# Patient Record
Sex: Female | Born: 1980 | Race: White | Hispanic: No | Marital: Married | Smoking: Current every day smoker
Health system: Southern US, Community
[De-identification: ages and names within clinical notes are randomized; demographics above are authoritative.]

## PROBLEM LIST (undated history)

## (undated) DIAGNOSIS — G8929 Other chronic pain: Secondary | ICD-10-CM

## (undated) DIAGNOSIS — M5137 Other intervertebral disc degeneration, lumbosacral region: Secondary | ICD-10-CM

## (undated) DIAGNOSIS — M51379 Other intervertebral disc degeneration, lumbosacral region without mention of lumbar back pain or lower extremity pain: Secondary | ICD-10-CM

## (undated) DIAGNOSIS — G43909 Migraine, unspecified, not intractable, without status migrainosus: Secondary | ICD-10-CM

## (undated) DIAGNOSIS — M549 Dorsalgia, unspecified: Secondary | ICD-10-CM

## (undated) HISTORY — PX: ABDOMINAL HYSTERECTOMY: SHX81

## (undated) HISTORY — PX: CHOLECYSTECTOMY: SHX55

---

## 2012-02-14 ENCOUNTER — Encounter (HOSPITAL_COMMUNITY): Payer: Self-pay | Admitting: *Deleted

## 2012-02-14 ENCOUNTER — Emergency Department (INDEPENDENT_AMBULATORY_CARE_PROVIDER_SITE_OTHER): Payer: Medicaid Other

## 2012-02-14 ENCOUNTER — Emergency Department (INDEPENDENT_AMBULATORY_CARE_PROVIDER_SITE_OTHER)
Admission: EM | Admit: 2012-02-14 | Discharge: 2012-02-14 | Disposition: A | Discharge status: Home / Self Care | Payer: Medicaid Other | Source: Home / Self Care | Attending: Emergency Medicine | Admitting: Emergency Medicine

## 2012-02-14 DIAGNOSIS — S335XXA Sprain of ligaments of lumbar spine, initial encounter: Secondary | ICD-10-CM

## 2012-02-14 DIAGNOSIS — S39012A Strain of muscle, fascia and tendon of lower back, initial encounter: Secondary | ICD-10-CM

## 2012-02-14 DIAGNOSIS — M549 Dorsalgia, unspecified: Secondary | ICD-10-CM | POA: Insufficient documentation

## 2012-02-14 HISTORY — DX: Dorsalgia, unspecified: M54.9

## 2012-02-14 HISTORY — DX: Other intervertebral disc degeneration, lumbosacral region: M51.37

## 2012-02-14 HISTORY — DX: Other intervertebral disc degeneration, lumbosacral region without mention of lumbar back pain or lower extremity pain: M51.379

## 2012-02-14 HISTORY — DX: Other chronic pain: G89.29

## 2012-02-14 MED ORDER — KETOROLAC TROMETHAMINE 60 MG/2ML IM SOLN
60.0000 mg | Freq: Once | INTRAMUSCULAR | Status: AC
  Administered 2012-02-14: 60 mg via INTRAMUSCULAR

## 2012-02-14 MED ORDER — METHOCARBAMOL 500 MG PO TABS: 500.0000 mg | ORAL_TABLET | Freq: Three times a day (TID) | ORAL | Status: AC

## 2012-02-14 MED ORDER — ONDANSETRON 4 MG PO TBDP
8.0000 mg | ORAL_TABLET | Freq: Once | ORAL | Status: AC
  Administered 2012-02-14: 8 mg via ORAL

## 2012-02-14 MED ORDER — KETOROLAC TROMETHAMINE 60 MG/2ML IM SOLN
INTRAMUSCULAR | Status: AC
  Filled 2012-02-14: qty 2

## 2012-02-14 MED ORDER — TRAMADOL HCL 50 MG PO TABS: 100.0000 mg | ORAL_TABLET | Freq: Three times a day (TID) | ORAL | Status: AC | PRN

## 2012-02-14 MED ORDER — ONDANSETRON 4 MG PO TBDP
ORAL_TABLET | ORAL | Status: AC
  Filled 2012-02-14: qty 2

## 2012-02-14 MED ORDER — MELOXICAM 15 MG PO TABS: 15.0000 mg | ORAL_TABLET | Freq: Every day | ORAL | Status: DC

## 2012-02-14 NOTE — ED Notes (Signed)
Patient is resting comfortably; denies needs at this time.

## 2012-02-14 NOTE — Discharge Instructions (Signed)
Back Exercises Back exercises help treat and prevent back injuries. The goal of back exercises is to increase the strength of your abdominal and back muscles and the flexibility of your back. These exercises should be started when you no longer have back pain. Back exercises include:  Pelvic Tilt. Lie on your back with your knees bent. Tilt your pelvis until the lower part of your back is against the floor. Hold this position 5 to 10 sec and repeat 5 to 10 times.   Knee to Chest. Pull first 1 knee up against your chest and hold for 20 to 30 seconds, repeat this with the other knee, and then both knees. This may be done with the other leg straight or bent, whichever feels better.   Sit-Ups or Curl-Ups. Bend your knees 90 degrees. Start with tilting your pelvis, and do a partial, slow sit-up, lifting your trunk only 30 to 45 degrees off the floor. Take at least 2 to 3 seconds for each sit-up. Do not do sit-ups with your knees out straight. If partial sit-ups are difficult, simply do the above but with only tightening your abdominal muscles and holding it as directed.   Hip-Lift. Lie on your back with your knees flexed 90 degrees. Push down with your feet and shoulders as you raise your hips a couple inches off the floor; hold for 10 seconds, repeat 5 to 10 times.   Back arches. Lie on your stomach, propping yourself up on bent elbows. Slowly press on your hands, causing an arch in your low back. Repeat 3 to 5 times. Any initial stiffness and discomfort should lessen with repetition over time.   Shoulder-Lifts. Lie face down with arms beside your body. Keep hips and torso pressed to floor as you slowly lift your head and shoulders off the floor.  Do not overdo your exercises, especially in the beginning. Exercises may cause you some mild back discomfort which lasts for a few minutes; however, if the pain is more severe, or lasts for more than 15 minutes, do not continue exercises until you see your  caregiver. Improvement with exercise therapy for back problems is slow.  See your caregivers for assistance with developing a proper back exercise program. Document Released: 09/23/2004 Document Revised: 08/05/2011 Document Reviewed: 08/16/2005 [employer] Patient Information 2012 [employer]. 
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
## 2012-03-11 ENCOUNTER — Encounter (HOSPITAL_COMMUNITY): Payer: Self-pay | Admitting: *Deleted

## 2012-03-11 ENCOUNTER — Emergency Department (HOSPITAL_COMMUNITY)
Admission: EM | Admit: 2012-03-11 | Discharge: 2012-03-11 | Disposition: A | Discharge status: Home / Self Care | Payer: Medicaid Other | Source: Home / Self Care | Attending: Emergency Medicine | Admitting: Emergency Medicine

## 2012-03-11 DIAGNOSIS — M549 Dorsalgia, unspecified: Secondary | ICD-10-CM

## 2012-03-11 DIAGNOSIS — G8929 Other chronic pain: Secondary | ICD-10-CM

## 2012-03-11 MED ORDER — DICLOFENAC SODIUM 75 MG PO TBEC: 75.0000 mg | DELAYED_RELEASE_TABLET | Freq: Two times a day (BID) | ORAL | Status: DC
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
## 2012-03-11 NOTE — ED Notes (Signed)
Pt seen here 3 1/2 wks ago for co fall, with co mid to low back pain, states pain has not gotten better, and feels tight with some "stabbing like" pain

## 2012-04-30 ENCOUNTER — Encounter (HOSPITAL_COMMUNITY): Payer: Self-pay | Admitting: Emergency Medicine

## 2012-04-30 ENCOUNTER — Encounter (HOSPITAL_COMMUNITY): Payer: Self-pay | Admitting: *Deleted

## 2012-04-30 ENCOUNTER — Emergency Department (HOSPITAL_COMMUNITY)
Admission: EM | Admit: 2012-04-30 | Discharge: 2012-04-30 | Disposition: A | Discharge status: Nursing Home (Includes ICF) | Payer: Medicaid Other | Source: Home / Self Care | Attending: Emergency Medicine | Admitting: Emergency Medicine

## 2012-04-30 ENCOUNTER — Emergency Department (HOSPITAL_COMMUNITY)
Admission: EM | Admit: 2012-04-30 | Discharge: 2012-05-01 | Disposition: A | Discharge status: Home / Self Care | Payer: Medicaid Other | Attending: Emergency Medicine | Admitting: Emergency Medicine

## 2012-04-30 DIAGNOSIS — R209 Unspecified disturbances of skin sensation: Secondary | ICD-10-CM

## 2012-04-30 DIAGNOSIS — Z9089 Acquired absence of other organs: Secondary | ICD-10-CM | POA: Insufficient documentation

## 2012-04-30 DIAGNOSIS — R51 Headache: Secondary | ICD-10-CM

## 2012-04-30 DIAGNOSIS — G8929 Other chronic pain: Secondary | ICD-10-CM | POA: Insufficient documentation

## 2012-04-30 DIAGNOSIS — R202 Paresthesia of skin: Secondary | ICD-10-CM

## 2012-04-30 DIAGNOSIS — M5137 Other intervertebral disc degeneration, lumbosacral region: Secondary | ICD-10-CM | POA: Insufficient documentation

## 2012-04-30 DIAGNOSIS — G43909 Migraine, unspecified, not intractable, without status migrainosus: Secondary | ICD-10-CM | POA: Insufficient documentation

## 2012-04-30 DIAGNOSIS — M51379 Other intervertebral disc degeneration, lumbosacral region without mention of lumbar back pain or lower extremity pain: Secondary | ICD-10-CM | POA: Insufficient documentation

## 2012-04-30 HISTORY — DX: Migraine, unspecified, not intractable, without status migrainosus: G43.909

## 2012-04-30 NOTE — ED Notes (Signed)
Pt to be tx to ED via shuttle.... Report called into triage nurse Efraim Kaufmann RN)

## 2012-04-30 NOTE — ED Notes (Signed)
Pt c/o migraines x6 days... Has hx of migraines... Sx include: nausea, vomiting, blurry vision and numbness on the right side of face... Has not been able to sleep well
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
## 2012-04-30 NOTE — ED Notes (Signed)
Pt from Urgent Care  states that she has had a migraine for the past 6 days. Pt states that she has tried numerous meds through the past 6 days. Pt states tylenol, ibuprofen, otc migriane meds and caffine with no relief. Pt alert and oriented, able to move all extremities. Pt states she is also having blurred vision, and facial numbness on her right cheek and right upper lip.

## 2012-04-30 NOTE — ED Notes (Signed)
Advised of the wait 

## 2012-05-01 ENCOUNTER — Emergency Department (HOSPITAL_COMMUNITY): Payer: Medicaid Other

## 2012-05-01 LAB — CBC
Hemoglobin: 14.1 g/dL (ref 12.0–15.0)
MCH: 31.1 pg (ref 26.0–34.0)
MCHC: 36.1 g/dL — ABNORMAL HIGH (ref 30.0–36.0)
Platelets: 234 10*3/uL (ref 150–400)
RDW: 13.1 % (ref 11.5–15.5)

## 2012-05-01 LAB — BASIC METABOLIC PANEL
Calcium: 10 mg/dL (ref 8.4–10.5)
GFR calc Af Amer: >90 mL/min (ref >90)
GFR calc non Af Amer: >90 mL/min (ref >90)
Potassium: 4 mEq/L (ref 3.5–5.1)
Sodium: 143 mEq/L (ref 135–145)

## 2012-05-01 MED ORDER — DIPHENHYDRAMINE HCL 50 MG/ML IJ SOLN
25.0000 mg | Freq: Once | INTRAMUSCULAR | Status: AC
  Administered 2012-05-01: 25 mg via INTRAVENOUS
  Filled 2012-05-01: qty 1

## 2012-05-01 MED ORDER — VALPROATE SODIUM 500 MG/5ML IV SOLN
500.0000 mg | Freq: Once | INTRAVENOUS | Status: DC
  Filled 2012-05-01: qty 5

## 2012-05-01 MED ORDER — VALPROATE SODIUM 500 MG/5ML IV SOLN
500.0000 mg | Freq: Once | INTRAVENOUS | Status: AC
  Administered 2012-05-01: 500 mg via INTRAVENOUS
  Filled 2012-05-01: qty 5

## 2012-05-01 MED ORDER — DEXAMETHASONE SODIUM PHOSPHATE 10 MG/ML IJ SOLN
10.0000 mg | Freq: Once | INTRAMUSCULAR | Status: DC
  Filled 2012-05-01: qty 1

## 2012-05-01 MED ORDER — METOCLOPRAMIDE HCL 5 MG/ML IJ SOLN
10.0000 mg | Freq: Once | INTRAMUSCULAR | Status: AC
  Administered 2012-05-01: 10 mg via INTRAVENOUS
  Filled 2012-05-01: qty 2

## 2012-05-01 MED ORDER — DEXAMETHASONE SODIUM PHOSPHATE 10 MG/ML IJ SOLN
10.0000 mg | Freq: Once | INTRAMUSCULAR | Status: AC
  Administered 2012-05-01: 10 mg via INTRAVENOUS

## 2012-05-01 MED ORDER — KETOROLAC TROMETHAMINE 30 MG/ML IJ SOLN
30.0000 mg | Freq: Once | INTRAMUSCULAR | Status: AC
  Administered 2012-05-01: 30 mg via INTRAVENOUS
  Filled 2012-05-01: qty 1

## 2012-05-01 MED ORDER — SODIUM CHLORIDE 0.9 % IV BOLUS (SEPSIS)
1000.0000 mL | Freq: Once | INTRAVENOUS | Status: AC
  Administered 2012-05-01: 1000 mL via INTRAVENOUS

## 2012-05-01 NOTE — ED Notes (Signed)
Pt for discharge.Vital signs stable.GCS 15 

## 2012-05-01 NOTE — ED Provider Notes (Signed)
Medical screening examination/treatment/procedure(s) were conducted as a shared visit with non-physician practitioner(s) and myself.  I personally evaluated the patient during the encounter  Pt with history of migraines has had 6 days of typical migraine pain, poorly controlled by OTC meds. Also noticed R sided facial tingling today. No facial droop, otherwise non-focal neuro exam. Headache not improved with typical migraine medications. Pt getting depacon now, sleeping soundly. Plan discharge when infusion is complete.   Charles B. Bernette Mayers, MD 05/01/12 [phone]
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
## 2012-05-01 NOTE — ED Notes (Signed)
Pt presented to ED with headache and numbness over left upper lip and nose.

## 2012-11-15 ENCOUNTER — Emergency Department: Payer: Self-pay | Admitting: Emergency Medicine

## 2013-02-20 LAB — COMPREHENSIVE METABOLIC PANEL
Albumin: 4.7 g/dL (ref 3.4–5.0)
Alkaline Phosphatase: 67 U/L (ref 50–136)
BUN: 14 mg/dL (ref 7–18)
Creatinine: 0.8 mg/dL (ref 0.60–1.30)
EGFR (African American): >60
Osmolality: 278 (ref 275–301)
Potassium: 3.2 mmol/L — ABNORMAL LOW (ref 3.5–5.1)
SGPT (ALT): 30 U/L (ref 12–78)
Total Protein: 7.4 g/dL (ref 6.4–8.2)

## 2013-02-20 LAB — DRUG SCREEN, URINE
Amphetamines, Ur Screen: NEGATIVE
Barbiturates, Ur Screen: NEGATIVE
Benzodiazepine, Ur Scrn: NEGATIVE
Cannabinoid 50 Ng, Ur ~~LOC~~: POSITIVE
Cocaine Metabolite,Ur ~~LOC~~: NEGATIVE
MDMA (Ecstasy)Ur Screen: NEGATIVE
Methadone, Ur Screen: NEGATIVE
Opiate, Ur Screen: NEGATIVE
Phencyclidine (PCP) Ur S: NEGATIVE
Tricyclic, Ur Screen: NEGATIVE

## 2013-02-20 LAB — URINALYSIS, COMPLETE
Bacteria: NONE SEEN
Bilirubin,UR: NEGATIVE
Glucose,UR: NEGATIVE mg/dL
Hyaline Cast: 1
Ketone: NEGATIVE
Nitrite: NEGATIVE
Ph: 5
Protein: NEGATIVE
RBC,UR: 21 /HPF
Specific Gravity: 1.028
Squamous Epithelial: 4
WBC UR: 15 /HPF

## 2013-02-20 LAB — CBC
HCT: 42.7 %
HGB: 15 g/dL
MCH: 31 pg
MCHC: 35.1 g/dL
MCV: 88 fL
Platelet: 187 10*3/uL
RBC: 4.84 X10 6/mm 3
RDW: 13.4 %
WBC: 11.1 10*3/uL — ABNORMAL HIGH

## 2013-02-20 LAB — TSH: Thyroid Stimulating Horm: 0.99 u[IU]/mL

## 2013-02-21 ENCOUNTER — Inpatient Hospital Stay: Payer: Self-pay | Admitting: Psychiatry

## 2013-02-22 LAB — BEHAVIORAL MEDICINE 1 PANEL
Albumin: 3.7 g/dL (ref 3.4–5.0)
Basophil #: 0 10*3/uL (ref 0.0–0.1)
Basophil %: 0.5 %
Bilirubin,Total: 0.6 mg/dL (ref 0.2–1.0)
Calcium, Total: 9 mg/dL (ref 8.5–10.1)
Co2: 25 mmol/L (ref 21–32)
EGFR (Non-African Amer.): >60
HGB: 13.1 g/dL (ref 12.0–16.0)
Lymphocyte %: 28.5 %
MCH: 30.6 pg (ref 26.0–34.0)
MCHC: 35 g/dL (ref 32.0–36.0)
MCV: 88 fL (ref 80–100)
Monocyte #: 0.6 x10 3/mm (ref 0.2–0.9)
Neutrophil #: 4.4 10*3/uL (ref 1.4–6.5)
Neutrophil %: 59.4 %
Osmolality: 279 (ref 275–301)
Platelet: 134 10*3/uL — ABNORMAL LOW (ref 150–440)
Potassium: 3.7 mmol/L (ref 3.5–5.1)
RDW: 13.5 % (ref 11.5–14.5)
SGPT (ALT): 23 U/L (ref 12–78)
Total Protein: 5.7 g/dL — ABNORMAL LOW (ref 6.4–8.2)

## 2013-02-23 LAB — URINALYSIS, COMPLETE
Bacteria: NONE SEEN
Bilirubin,UR: NEGATIVE
Blood: NEGATIVE
Glucose,UR: NEGATIVE mg/dL (ref 0–75)
Nitrite: NEGATIVE
Ph: 6 (ref 4.5–8.0)
RBC,UR: <1 /HPF (ref 0–5)
WBC UR: 2 /HPF (ref 0–5)

## 2013-02-26 LAB — CBC WITH DIFFERENTIAL/PLATELET
Eosinophil %: 3.3 %
HCT: 39.7 % (ref 35.0–47.0)
HGB: 13.9 g/dL (ref 12.0–16.0)
Lymphocyte #: 2 10*3/uL (ref 1.0–3.6)
MCH: 30.9 pg (ref 26.0–34.0)
MCV: 88 fL (ref 80–100)
Monocyte #: 0.8 x10 3/mm (ref 0.2–0.9)
Monocyte %: 8.6 %
RDW: 13.6 % (ref 11.5–14.5)
WBC: 9 10*3/uL (ref 3.6–11.0)

## 2013-04-16 ENCOUNTER — Emergency Department: Payer: Self-pay | Admitting: Emergency Medicine

## 2013-04-23 ENCOUNTER — Emergency Department: Payer: Self-pay | Admitting: Emergency Medicine

## 2013-05-06 ENCOUNTER — Encounter (HOSPITAL_COMMUNITY): Payer: Self-pay | Admitting: *Deleted

## 2013-05-06 ENCOUNTER — Emergency Department (HOSPITAL_COMMUNITY)
Admission: EM | Admit: 2013-05-06 | Discharge: 2013-05-06 | Disposition: A | Discharge status: Home / Self Care | Payer: Medicaid Other | Attending: Emergency Medicine | Admitting: Emergency Medicine

## 2013-05-06 ENCOUNTER — Emergency Department (HOSPITAL_COMMUNITY): Payer: Medicaid Other

## 2013-05-06 DIAGNOSIS — S022XXA Fracture of nasal bones, initial encounter for closed fracture: Secondary | ICD-10-CM

## 2013-05-06 DIAGNOSIS — F17200 Nicotine dependence, unspecified, uncomplicated: Secondary | ICD-10-CM | POA: Insufficient documentation

## 2013-05-06 DIAGNOSIS — Y929 Unspecified place or not applicable: Secondary | ICD-10-CM | POA: Insufficient documentation

## 2013-05-06 DIAGNOSIS — Y939 Activity, unspecified: Secondary | ICD-10-CM | POA: Insufficient documentation

## 2013-05-06 DIAGNOSIS — R51 Headache: Secondary | ICD-10-CM | POA: Insufficient documentation

## 2013-05-06 DIAGNOSIS — Z8739 Personal history of other diseases of the musculoskeletal system and connective tissue: Secondary | ICD-10-CM | POA: Insufficient documentation

## 2013-05-06 DIAGNOSIS — W2209XA Striking against other stationary object, initial encounter: Secondary | ICD-10-CM | POA: Insufficient documentation

## 2013-05-06 MED ORDER — OXYCODONE-ACETAMINOPHEN 5-325 MG PO TABS: ORAL_TABLET | ORAL | Status: AC

## 2013-05-06 MED ORDER — OXYCODONE-ACETAMINOPHEN 5-325 MG PO TABS
2.0000 | ORAL_TABLET | Freq: Once | ORAL | Status: AC
  Administered 2013-05-06: 2 via ORAL
  Filled 2013-05-06: qty 2
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
## 2013-05-06 NOTE — ED Notes (Signed)
Pt denies any pain or questions upon discharge. 
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
## 2013-05-07 NOTE — ED Provider Notes (Signed)
Medical screening examination/treatment/procedure(s) were performed by non-physician practitioner and as supervising physician I was immediately available for consultation/collaboration.  Dione Booze, MD 05/07/13 [phone]

## 2013-05-24 ENCOUNTER — Ambulatory Visit (INDEPENDENT_AMBULATORY_CARE_PROVIDER_SITE_OTHER): Payer: Medicaid Other | Admitting: Otolaryngology

## 2014-03-22 IMAGING — CR DG LUMBAR SPINE COMPLETE 4+V
5 series · 5 of 5 positions shown · non-contrast
Comparison: None

CLINICAL DATA: Low back pain post fall

LUMBAR SPINE - COMPLETE 4+ VIEW

[view not recorded (1 of 5)]
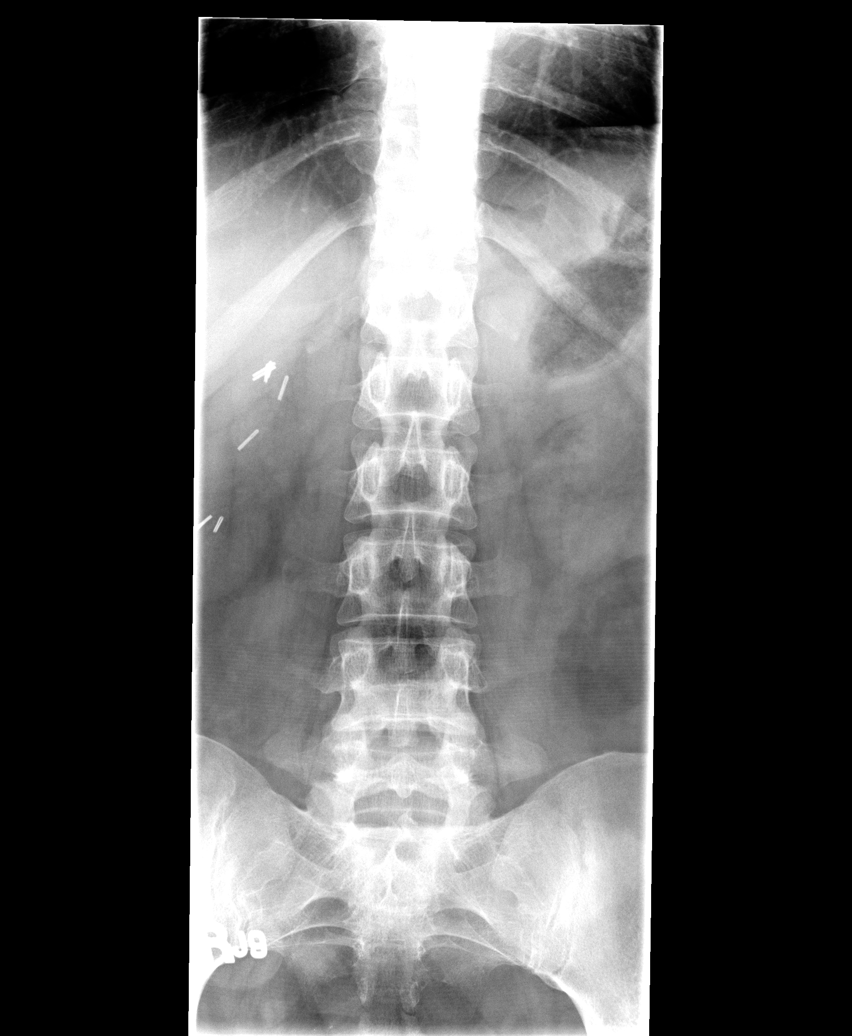

[view not recorded (2 of 5)]
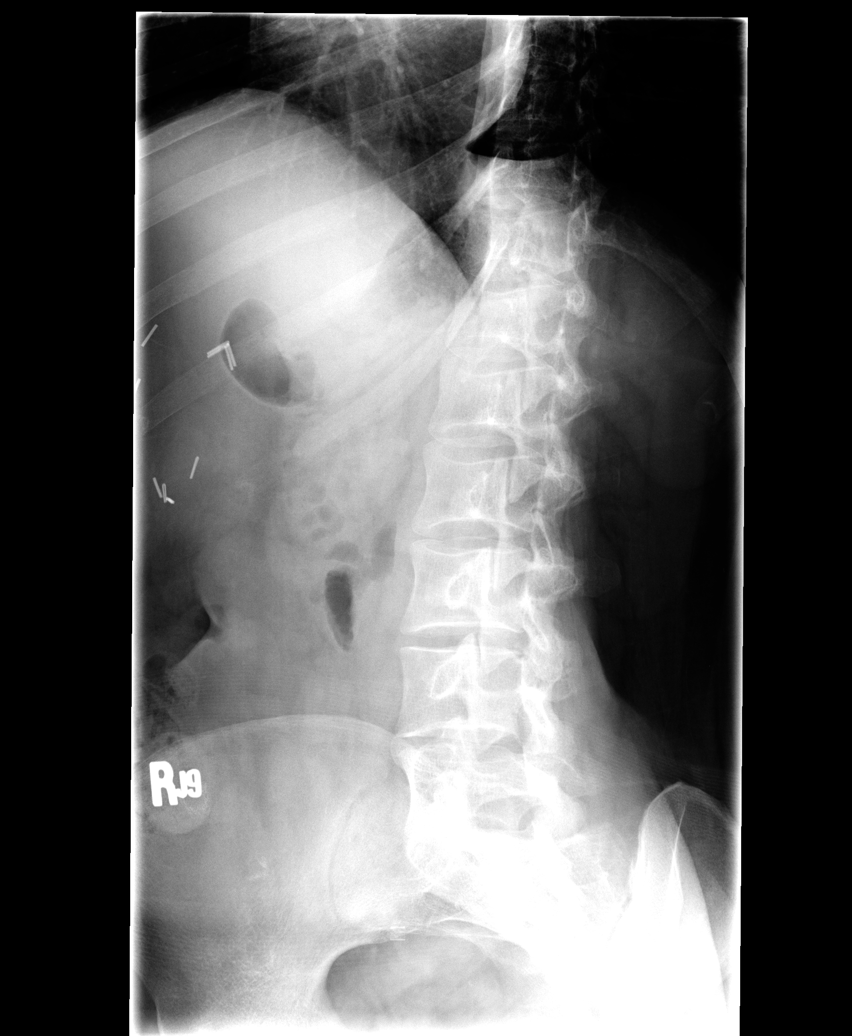

[view not recorded (3 of 5)]
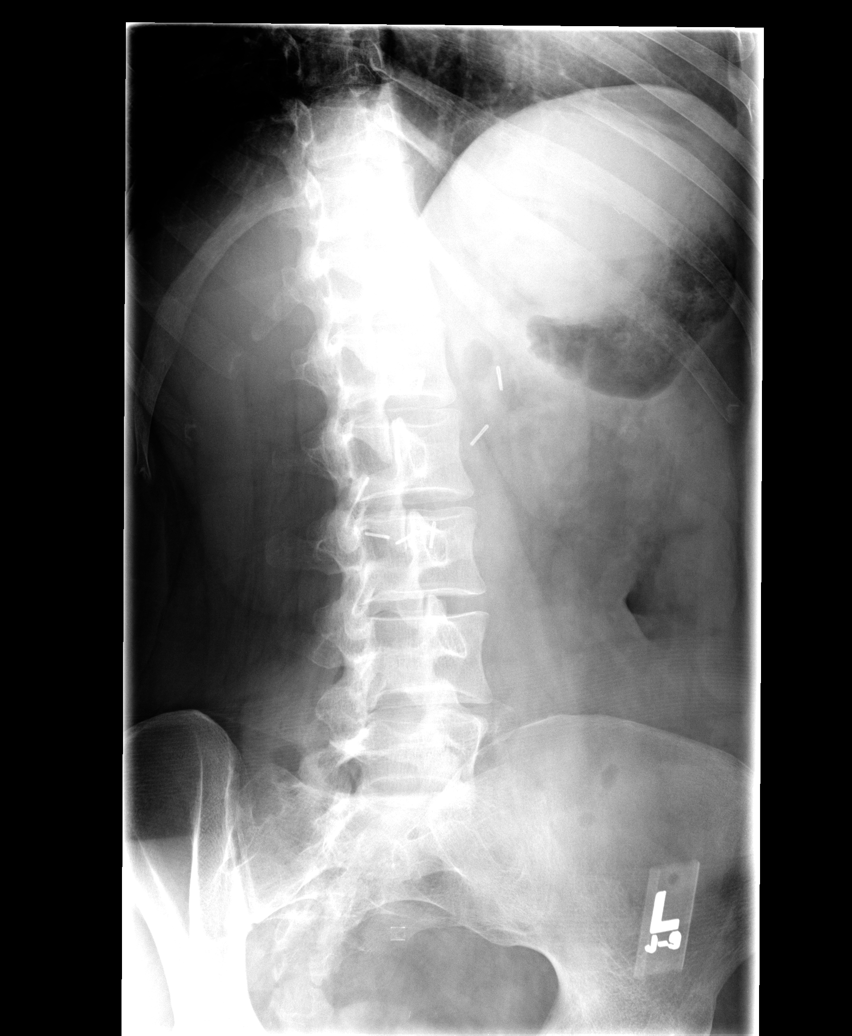

[view not recorded (4 of 5)]
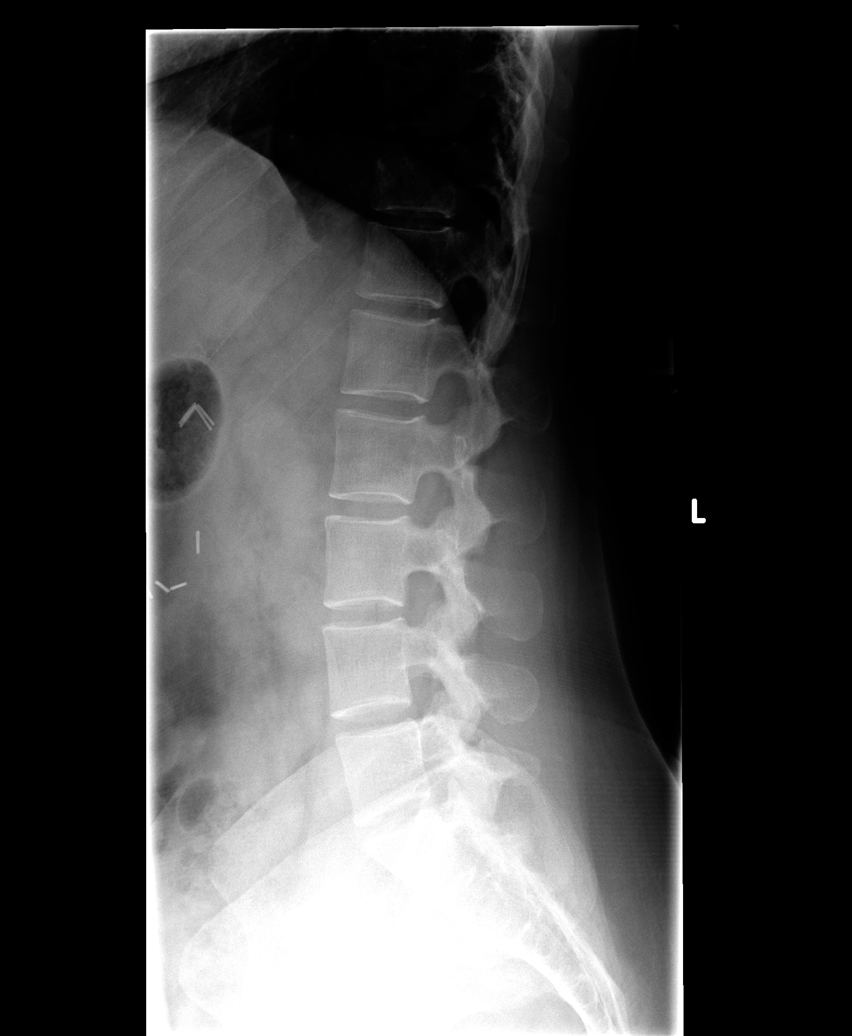

[view not recorded (5 of 5)]
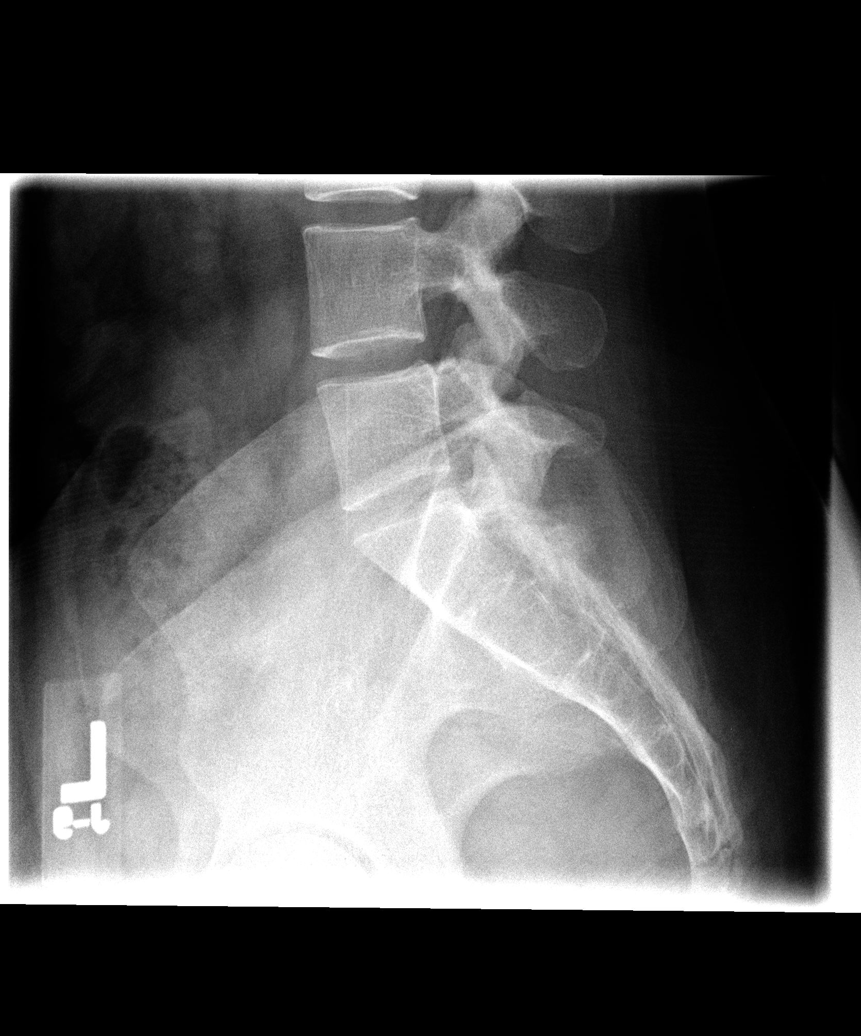

[5 of 5 positions shown; findings below may reference images not displayed]

FINDINGS: Hypoplastic last rib pair.
Five non-rib bearing lumbar vertebrae.
Osseous mineralization grossly normal.
Vertebral body and disc space heights maintained.
No acute fracture, subluxation or bone destruction.
No spondylolysis or bone destruction.
SI joints symmetric.
Surgical clips right upper quadrant.
IMPRESSION: No acute lumbar spine abnormalities.

## 2014-04-01 LAB — URINALYSIS, COMPLETE
Bacteria: NONE SEEN
Bilirubin,UR: NEGATIVE
Blood: NEGATIVE
Glucose,UR: NEGATIVE mg/dL (ref 0–75)
Hyaline Cast: 2
KETONE: NEGATIVE
Nitrite: NEGATIVE
Ph: 5 (ref 4.5–8.0)
Protein: NEGATIVE
Specific Gravity: 1.026 (ref 1.003–1.030)
WBC UR: 7 /HPF (ref 0–5)

## 2014-04-01 LAB — COMPREHENSIVE METABOLIC PANEL
ALK PHOS: 57 U/L
ALT: 20 U/L
Albumin: 4.7 g/dL (ref 3.4–5.0)
Anion Gap: 10 (ref 7–16)
BILIRUBIN TOTAL: 0.4 mg/dL (ref 0.2–1.0)
BUN: 8 mg/dL (ref 7–18)
CALCIUM: 9.5 mg/dL (ref 8.5–10.1)
CHLORIDE: 106 mmol/L (ref 98–107)
CREATININE: 0.79 mg/dL (ref 0.60–1.30)
Co2: 26 mmol/L (ref 21–32)
EGFR (African American): >60
GLUCOSE: 85 mg/dL (ref 65–99)
Osmolality: 281 (ref 275–301)
Potassium: 3.1 mmol/L — ABNORMAL LOW (ref 3.5–5.1)
SGOT(AST): 14 U/L — ABNORMAL LOW (ref 15–37)
Sodium: 142 mmol/L (ref 136–145)
TOTAL PROTEIN: 7.6 g/dL (ref 6.4–8.2)

## 2014-04-01 LAB — CBC
HCT: 43.5 % (ref 35.0–47.0)
HGB: 15 g/dL (ref 12.0–16.0)
MCH: 30.6 pg (ref 26.0–34.0)
MCHC: 34.5 g/dL (ref 32.0–36.0)
MCV: 89 fL (ref 80–100)
PLATELETS: 196 10*3/uL (ref 150–440)
RBC: 4.9 10*6/uL (ref 3.80–5.20)
RDW: 13.7 % (ref 11.5–14.5)
WBC: 9.1 10*3/uL (ref 3.6–11.0)

## 2014-04-01 LAB — DRUG SCREEN, URINE
Amphetamines, Ur Screen: NEGATIVE (ref ?–1000)
BARBITURATES, UR SCREEN: NEGATIVE (ref ?–200)
Benzodiazepine, Ur Scrn: NEGATIVE (ref ?–200)
Cannabinoid 50 Ng, Ur ~~LOC~~: POSITIVE (ref ?–50)
Cocaine Metabolite,Ur ~~LOC~~: NEGATIVE (ref ?–300)
MDMA (ECSTASY) UR SCREEN: NEGATIVE (ref ?–500)
Methadone, Ur Screen: NEGATIVE (ref ?–300)
OPIATE, UR SCREEN: NEGATIVE (ref ?–300)
Phencyclidine (PCP) Ur S: NEGATIVE (ref ?–25)
Tricyclic, Ur Screen: NEGATIVE (ref ?–1000)

## 2014-04-01 LAB — SALICYLATE LEVEL: Salicylates, Serum: 3.9 mg/dL — ABNORMAL HIGH

## 2014-04-01 LAB — ACETAMINOPHEN LEVEL: Acetaminophen: <2 ug/mL

## 2014-04-01 LAB — PREGNANCY, URINE: Pregnancy Test, Urine: NEGATIVE m[IU]/mL

## 2014-04-01 LAB — ETHANOL
Ethanol %: <0.003 % (ref 0.000–0.080)
Ethanol: <3 mg/dL

## 2014-04-02 ENCOUNTER — Inpatient Hospital Stay: Payer: Self-pay | Admitting: Psychiatry

## 2014-04-03 LAB — DRUG SCREEN, URINE
Amphetamines, Ur Screen: NEGATIVE (ref ?–1000)
Barbiturates, Ur Screen: NEGATIVE (ref ?–200)
Benzodiazepine, Ur Scrn: NEGATIVE (ref ?–200)
Cannabinoid 50 Ng, Ur ~~LOC~~: POSITIVE (ref ?–50)
Cocaine Metabolite,Ur ~~LOC~~: NEGATIVE (ref ?–300)
MDMA (Ecstasy)Ur Screen: NEGATIVE (ref ?–500)
Methadone, Ur Screen: NEGATIVE (ref ?–300)
Opiate, Ur Screen: NEGATIVE (ref ?–300)
Phencyclidine (PCP) Ur S: NEGATIVE (ref ?–25)
Tricyclic, Ur Screen: NEGATIVE (ref ?–1000)

## 2014-04-04 LAB — TSH: Thyroid Stimulating Horm: 0.746 u[IU]/mL

## 2014-12-20 NOTE — Consult Note (Signed)
Brief Consult Note: Diagnosis: drug rash.   Patient was seen by consultant.   Consult note dictated.   Recommend further assessment or treatment.   Orders entered.   Discussed with Attending MD.   Comments: *morbiliform rash pt noticed about 3 days ago.  in truck on abd. prednisone 20mg  daily for 3 days, standing benadryl. for 6 doses. likly from risperdal vs iboprofen(less likley as pt has occasionally taken without issues).  moniter *low plts.  recheck '*tobacco abuse. counsaled for 3 min.  Electronic Signatures: Krystal EatonAhmadzia, Lynnann Knudsen (MD)  (Signed 29-Jun-14 17:02)  Authored: Brief Consult Note   Last Updated: 29-Jun-14 17:02 by Krystal EatonAhmadzia, Maryann Mccall (MD)

## 2014-12-20 NOTE — Consult Note (Signed)
PATIENT NAME:  Michaela Wright, Nettye MR#:  [id number] DATE OF BIRTH:  10/15/1980  DATE OF CONSULTATION:  02/25/2013  REFERRING PHYSICIAN:  Brandy HaleUzma Faheem, MD from [facility] CONSULTING PHYSICIAN:  Krystal EatonShayiq Jamyah Folk, MD  REASON FOR CONSULT: A rash.   HISTORY OF PRESENT ILLNESS:  The patient is a pleasant, 34 year old Caucasian female with a history of mild anemia, polysubstance abuse, tobacco abuse [facility]. The patient states that she has had several medications tried while here for her psych issues including Risperdal, Ativan, BuSpar and fluoxetine. The patient stated that she noticed a rash on her abdomen about 3 days ago which was itchy.  The patient stated initially it was reddish in color without any drainage. Today, she started to see another spot on her left lower extremity, and, therefore, Medicine was consulted for help with this rash. The patient stated that she has had ibuprofen in the past without any issues, and she takes it occasionally, however, not on a regular basis. The patient also stated that she did have some Risperdal when she was 14. Ibuprofen and Risperdal have been stopped.  The rash is on the trunk mostly on the abdominal side. She has no fevers, and the rashes are not draining. It is nontender.   PAST MEDICAL HISTORY: History of alcohol and drug abuse, chronic back pain, mild anemia.   PAST SURGICAL HISTORY: C-section, tubal ligation, hysterectomy.   ALLERGIES: CIPRO.   SOCIAL HISTORY: Positive for tobacco, positive for alcohol and polysubstance abuse including marijuana, Vicodin and Percocet.  That is [facility]. She is married.   FAMILY HISTORY: Angina, cancer and MI.   OUTPATIENT MEDICATIONS:  None.  MEDICATIONS HERE: As above, including she is on BuSpar 10 mg b.i.d., hydroxyzine 25 mg q.i.d., mirtazapine 15 mg at bedtime, pantoprazole 40 mg b.i.d., potassium chloride 10 mEq  daily, trazodone 50 mg at bedtime.   REVIEW OF SYSTEMS:  CONSTITUTIONAL: Positive for a headache from clenching her jaw from stress, otherwise no fatigue, weakness or weight changes.  EYES: No blurry vision or double vision.  ENT: No tinnitus, hearing loss or snoring.  RESPIRATORY: Positive for cough.  No wheezing. No shortness of breath. No asthma or COPD.  CARDIOVASCULAR: No chest pain, orthopnea or swelling in the legs. No history of hypertension.  GASTROINTESTINAL: No nausea, vomiting, diarrhea, abdominal pain, rectal bleeding.  GENITOURINARY: Denies dysuria or hematuria.  HEMATOLOGIC/LYMPHATIC: Has chronic mild anemia.  ENDOCRINE:  No polyuria or nocturia.  SKIN: Rash as above.  NEUROLOGICAL:  No numbness, weakness, stroke or seizures in the past.  PSYCHIATRIC: Positive for anxiety and depression.   PHYSICAL EXAMINATION: VITAL SIGNS: Temperature was 97.7, pulse rate 95, respiratory rate 18, blood pressure 107/71. GENERAL:  The patient is a thin Caucasian female. No obvious distress, sitting on bed, talking in full sentences.  HEENT: Normocephalic, atraumatic. Pupils are equal. Extraocular muscles are intact. Moist mucous membranes.  NECK: Supple. No thyroid tenderness. No cervical lymphadenopathy.  CARDIOVASCULAR: S1, S2.  Regular rate and rhythm. No murmurs, rubs or gallops.  LUNGS: Clear to auscultation without wheezing, rhonchi or rales.  ABDOMEN: Soft, nontender, nondistended. Positive bowel sounds in all quadrants.  No hepatomegaly. EXTREMITIES: No significant lower extremity edema.  NEUROLOGIC: Cranial nerves II through XII appear to be grossly intact. Strength is 5 out of 5 in all extremities.  PSYCHIATRIC:  Awake, alert, oriented x 3. Cooperative, pleasant.  SKIN: The patient has multiple tattoos. There is  a morbilliform rash involving the abdomen, and nonblanching and some petechia. There are a couple of patchy areas on the lower extremity on the right above the knee. Again,  these are nonblanching. These are nontender. No other rashes on the body otherwise.   LABORATORY DATA: No recent labs, but on the 26th BUN was 12, creatinine 0.78, sodium 140, potassium 3.7, AST was 10, total protein 5.7. TSH was 1.34. Platelets were 134, which was down from 187 on 06/24. WBC was 7.4, hemoglobin 13.1 UA from the 27th not suggestive of infection.   ASSESSMENT AND PLAN: We have a pleasant 34 year old female who is here in [facility] for polysubstance abuse who first noticed a rash about 3 days ago or so involving the abdomen. This appears to be morbilliform in nature and likely drug-induced. The patient has had ibuprofen and Risperdal, both of which are possible culprits. The patient takes the ibuprofen more frequently than the Risperdal, which she took about a decade and a half ago. At this point, they are both stopped.  Because she has ongoing rash, I would start  the patient on prednisone 20 mg daily for 3 days in a stacked format to see if the rash goes away, in addition to Benadryl p.o. standing 50 mg q. 6 hours.  I would monitor the rash. The rash appears to be stable on the abdomen, per her, and is not as red and erythematous as prior, but she was concerned about the slight patchy area in the lower extremity which I think is not very significant at this point. I would monitor the rash. I would recommend holding further ibuprofen and NSAIDs for now as well. She can take Tylenol. She is not on Risperdal, which I would recommend not continuing at this point. The patient does have a mild drop in the platelets, and  this would be trended. The patient does smoke tobacco and was counseled for 3 minutes about her abuse, and she is motivated  but is unsure if she wants to quit. In regards to the polysubstance abuse, per [facility].   Thank you for this consult, and we will follow along with you.   TOTAL TIME SPENT: 40 minutes.   ____________________________ Krystal Eaton,  MD sa:cb D: 02/25/2013 17:11:15 ET T: 02/25/2013 20:04:03 ET JOB#: [id number]  cc: Krystal Eaton, MD, <Dictator> Krystal Eaton MD ELECTRONICALLY SIGNED 03/09/2013 15:06

## 2014-12-21 NOTE — Consult Note (Signed)
PATIENT NAME:  Michaela Wright, Madlynn MR#:  [id number] DATE OF BIRTH:  1981/04/13  DATE OF CONSULTATION:  04/02/2014  CONSULTING PHYSICIAN:  Audery AmelJohn T. Sian Rockers, MD  IDENTIFYING INFORMATION AND REASON FOR CONSULT: This is a 34 year old woman who reports a history of alcohol and opiate dependence who came into the hospital requesting detox but now says she is suicidal. Consultation for appropriate disposition.   HISTORY OF PRESENT ILLNESS: Information obtained from the patient and the chart. The patient came into the hospital telling me that she is seeking detox from the drugs that she is abusing. Says that she relapsed into using opiates several days ago and is using 4-5 tablets of oral narcotics every day. She had been sober for a little while before that and then relapsed. She also says she drinks, usually binge drinking 6-7 drinks at a time a few times a week. Uses marijuana daily. Mood recently has been more depressed and feeling hopeless about her situation. Energy level poor, sleep poor. Has worries that she is not going to be able to stop on her own. She is telling me she has suicidal ideation now. Does not report any specific plan or intention to hurt herself, it is more passive.   PAST PSYCHIATRIC HISTORY: The patient says that she has been treated once before in a hospital for a suicide attempt when she was about [age], but then after that no further suicide attempts or hospital treatment until about 1 year ago. She is not currently going for any kind of outpatient treatment. Has been using drugs and alcohol since she was an adolescent. Tells me that she managed to do 6 years sober in the early 2000s but has been relapsing frequently since then.   PAST MEDICAL HISTORY: No known significant ongoing medical problems. She has been told she had fibromyalgia but she does not believe it, she thinks it is probably related to the narcotics.   FAMILY HISTORY: She has a positive family history of drug problems.    SOCIAL HISTORY: She is married, has 2 children, ages 188 and 2111. She is not currently working. She says that she will be able to go back to live with her family again if she can get sober, but she feels like she needs inpatient substance abuse treatment first.   CURRENT MEDICATIONS: None.   ALLERGIES: CIPRO AND DEMEROL.   REVIEW OF SYSTEMS:  The patient complains of feeling achy all over. Mildly upset stomach, but not throwing up, no diarrhea or other gut problems. Has not been excessively dizzy. She does feel sad and has passive suicidal ideation. Does not report any hallucinations.   MENTAL STATUS EXAMINATION: Neatly groomed woman, looks her stated age, cooperative with the interview. Eye contact good. Psychomotor activity normal. Speech is quiet but full range, normal rate. Affect is tearful at times but appropriate and reactive. Mood is stated as being "bad." Thoughts are lucid. No evidence of loosening of associations or delusions. Denies hallucinations. Denies homicidal ideation. Has suicidal ideation without any specific plan in place. She is alert and oriented x 4. Can remember 3 out of 3 objects immediately and at 3 minutes, longer term memory appears to be intact.   LABORATORY RESULTS: Drug screen positive for cannabis, notably it is not positive for opiates. Alcohol level is also zero. Chemistry just shows a slightly low potassium level. Nothing else really significant. CBC is all normal.   Urinalysis: No sign of infection. Pregnancy test negative. Salicylates slightly elevated but not toxic. Acetaminophen  negative.   VITAL SIGNS: Her current blood pressure is 102/63, respirations 14, pulse 62, temperature 98.8.   CURRENT MEDICATIONS: In the Emergency Room she was started on some detox protocol and has received the vitamins, doesn't look like she has required any detox medicine this morning.   ASSESSMENT: A 34 year old woman who is reporting abuse of alcohol, opiates and marijuana. By  medical criteria she does not require medical detox but she is now reporting suicidal ideation. Depressed and tearful. Warrants admission because of concern about suicidal ideation.   TREATMENT PLAN: Admit to psychiatry. She is on alcohol detox. Does not appear to need specific opiate withdrawal. Did write for some Robaxin as a p.r.n. She can work with inpatient team about appropriate inpatient treatment and followup.   DIAGNOSIS, PRINCIPAL AND PRIMARY:  AXIS I: Substance-induced mood disorder, depressed.   SECONDARY DIAGNOSES:  AXIS I:  1.  Opiate dependence.  2.  Alcohol dependence.  AXIS II: Deferred.  AXIS III: No diagnosis.  AXIS IV: Moderate, from ongoing substance abuse.  AXIS V: Functioning at time of admission: 50.    ____________________________ Audery Amel, MD jtc:lt D: 04/02/2014 11:14:28 ET T: 04/02/2014 11:39:55 ET JOB#: [id number]  cc: Audery Amel, MD, <Dictator> Audery Amel MD ELECTRONICALLY SIGNED 04/25/2014 22:43

## 2014-12-21 NOTE — Discharge Summary (Signed)
PATIENT NAME:  Michaela Wright, Michaela Wright MR#:  [id number] DATE OF BIRTH:  02/07/1981  DATE OF ADMISSION:  04/02/2014 DATE OF DISCHARGE:  04/05/2014  REASON FOR ADMISSION: Detoxification from opiates and suicidal ideation.  DISCHARGE DIAGNOSES: AXIS I: Opiate-induced mood disorder. Opiate use disorder. Cannabis use disorder. Alcohol use disorder. AXIS II: Deferred. AXIS III: None. AXIS IV: Recent relapse. AXIS V: Global assessment of functioning of 55.  DISCHARGE MEDICATIONS: Trazodone 50 mg p.o. at bedtime for insomnia, Vistaril 25 mg every 8 hours as needed for anxiety.  HOSPITAL COURSE: Patient presented voluntarily to the Emergency Department requesting psychiatric hospitalization due to opiate dependence and having suicidal ideation. The patient reported a long history of substance abuse that started around the age of 34 with the use of marijuana. The patient later on escalated to episodes of heavy drinking that caused blackouts and the use of opiates. Patient states that since the beginning of the year, she had been able to abstain from the use of opiates; however, she continued to use alcohol and marijuana. The opiates she uses are mainly pills that she buys on the street and swallows. Patient stated the trigger this time for the relapse was a toothache. Patient was motivated for treatment, as she is married and has 2 children. She would like to be a better mother and a better wife. She reports that herself as a child suffered from growing up with her parents who were drug addicts. During her hospitalization, the patient requested to be sent to inpatient rehabilitation. However, the patient has never been engaged in any type of outpatient treatment to justify the use of inpatient rehabilitation for substance abuse. She was advised instead to attend intensive outpatient for substance abuse, which she was willing to do. This hospitalization was uneventful. The patient was pleasant and cooperative at all  times. There was no need for seclusion or restraints. She was started on trazodone for insomnia and Vistaril as needed for anxiety. The depressive symptoms were not felt to be secondary to a depressive disorder, as they started immediately after the relapse on opiates prior to admission.  MENTAL STATUS EXAMINATION: At the time of the discharge, the patient was alert. She was oriented to person, place, time, and situation. She was pleasant and cooperative. Activity was within normal range. Eye contact was within normal range. Speech had a regular tone, volume, and rate. Thought process was linear and goal directed. Thought content was negative for suicidality, homicidality. Perception was negative for psychosis. Mood euthymic. Affect reactive. Insight and judgment good.  LABORATORY RESULTS: BUN 8, creatinine 0.79, sodium 142, potassium 3.1. Patient received K-Dur for 2 days. Calcium 9.5. Alcohol at admission was below detection limit. AST 14, ALT 20. TSH 0.74. Urine toxicology screen was positive for cannabis. WBC 9.1, hemoglobin 15, hematocrit 43.5. Pregnancy test was negative.  DISCHARGE DISPOSITION: The patient will be discharged back home with her family.  DISCHARGE FOLLOWUP: The patient has been [place] to [place] [facility] [place]. She has an appointment for outpatient social service treatment on 04/08/2014. A 9:00 a.m. group is held every morning Monday through Friday at 9:00 a.m. Phone number is [phone], fax number [phone].    ____________________________ Jimmy FootmanAndrea Hernandez-Gonzalez, MD ahg:sk D: 04/08/2014 15:16:58 ET T: 04/08/2014 23:27:24 ET JOB#: [id number]  cc: Jimmy FootmanAndrea Hernandez-Gonzalez, MD, <Dictator> Horton ChinANDREA HERNANDEZ GONZAL MD ELECTRONICALLY SIGNED 04/09/2014 16:27
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
